# Patient Record
Sex: Male | Born: 1935 | Race: White | Hispanic: No | Marital: Married | Smoking: Former smoker
Health system: Southern US, Community
[De-identification: ages and names within clinical notes are randomized; demographics above are authoritative.]

## PROBLEM LIST (undated history)

## (undated) DIAGNOSIS — J449 Chronic obstructive pulmonary disease, unspecified: Secondary | ICD-10-CM

## (undated) DIAGNOSIS — I2510 Atherosclerotic heart disease of native coronary artery without angina pectoris: Secondary | ICD-10-CM

## (undated) DIAGNOSIS — T8859XA Other complications of anesthesia, initial encounter: Secondary | ICD-10-CM

## (undated) DIAGNOSIS — E119 Type 2 diabetes mellitus without complications: Secondary | ICD-10-CM

## (undated) DIAGNOSIS — K219 Gastro-esophageal reflux disease without esophagitis: Secondary | ICD-10-CM

## (undated) DIAGNOSIS — F419 Anxiety disorder, unspecified: Secondary | ICD-10-CM

## (undated) DIAGNOSIS — T4145XA Adverse effect of unspecified anesthetic, initial encounter: Secondary | ICD-10-CM

## (undated) DIAGNOSIS — R58 Hemorrhage, not elsewhere classified: Secondary | ICD-10-CM

## (undated) DIAGNOSIS — Z8719 Personal history of other diseases of the digestive system: Secondary | ICD-10-CM

## (undated) DIAGNOSIS — I639 Cerebral infarction, unspecified: Secondary | ICD-10-CM

## (undated) DIAGNOSIS — R0602 Shortness of breath: Secondary | ICD-10-CM

## (undated) DIAGNOSIS — M1990 Unspecified osteoarthritis, unspecified site: Secondary | ICD-10-CM

## (undated) DIAGNOSIS — K429 Umbilical hernia without obstruction or gangrene: Secondary | ICD-10-CM

## (undated) DIAGNOSIS — S31109A Unspecified open wound of abdominal wall, unspecified quadrant without penetration into peritoneal cavity, initial encounter: Secondary | ICD-10-CM

## (undated) DIAGNOSIS — I10 Essential (primary) hypertension: Secondary | ICD-10-CM

## (undated) DIAGNOSIS — I509 Heart failure, unspecified: Secondary | ICD-10-CM

## (undated) DIAGNOSIS — R51 Headache: Secondary | ICD-10-CM

## (undated) DIAGNOSIS — J45909 Unspecified asthma, uncomplicated: Secondary | ICD-10-CM

## (undated) DIAGNOSIS — I219 Acute myocardial infarction, unspecified: Secondary | ICD-10-CM

## (undated) DIAGNOSIS — L089 Local infection of the skin and subcutaneous tissue, unspecified: Secondary | ICD-10-CM

## (undated) DIAGNOSIS — J189 Pneumonia, unspecified organism: Secondary | ICD-10-CM

## (undated) DIAGNOSIS — D649 Anemia, unspecified: Secondary | ICD-10-CM

## (undated) HISTORY — PX: APPENDECTOMY: SHX54

## (undated) HISTORY — PX: SPLENECTOMY, TOTAL: SHX788

## (undated) HISTORY — PX: CHOLECYSTECTOMY: SHX55

## (undated) HISTORY — PX: TONSILLECTOMY: SUR1361

## (undated) HISTORY — PX: COLON SURGERY: SHX602

## (undated) HISTORY — PX: HERNIA REPAIR: SHX51

## (undated) HISTORY — PX: CARDIAC CATHETERIZATION: SHX172

---

## 1988-02-22 DIAGNOSIS — I219 Acute myocardial infarction, unspecified: Secondary | ICD-10-CM

## 1988-02-22 HISTORY — DX: Acute myocardial infarction, unspecified: I21.9

## 2005-06-30 ENCOUNTER — Ambulatory Visit: Payer: Self-pay | Admitting: Cardiovascular Disease

## 2005-06-30 ENCOUNTER — Inpatient Hospital Stay (HOSPITAL_COMMUNITY): Admission: EM | Admit: 2005-06-30 | Discharge: 2005-07-05 | Payer: Self-pay | Admitting: Emergency Medicine

## 2005-06-30 ENCOUNTER — Ambulatory Visit: Payer: Self-pay | Admitting: Hospitalist

## 2005-07-06 ENCOUNTER — Ambulatory Visit: Payer: Self-pay | Admitting: Internal Medicine

## 2005-07-07 ENCOUNTER — Encounter (HOSPITAL_BASED_OUTPATIENT_CLINIC_OR_DEPARTMENT_OTHER): Admission: RE | Admit: 2005-07-07 | Discharge: 2005-08-04 | Payer: Self-pay | Admitting: Surgery

## 2007-11-10 IMAGING — CR DG ABDOMEN ACUTE W/ 1V CHEST
4 series · 4 of 4 positions shown · non-contrast
Comparison: none

CLINICAL DATA: Abdominal pain, nausea, vomiting, drainage from abdominal hernia, shortness of breath, hypertension, diabetes, smoker, COPD.
ACUTE ABDOMINAL SERIES WITH CHEST ? 4 VIEWS ? 06/30/05:

[view not recorded (1 of 4)]
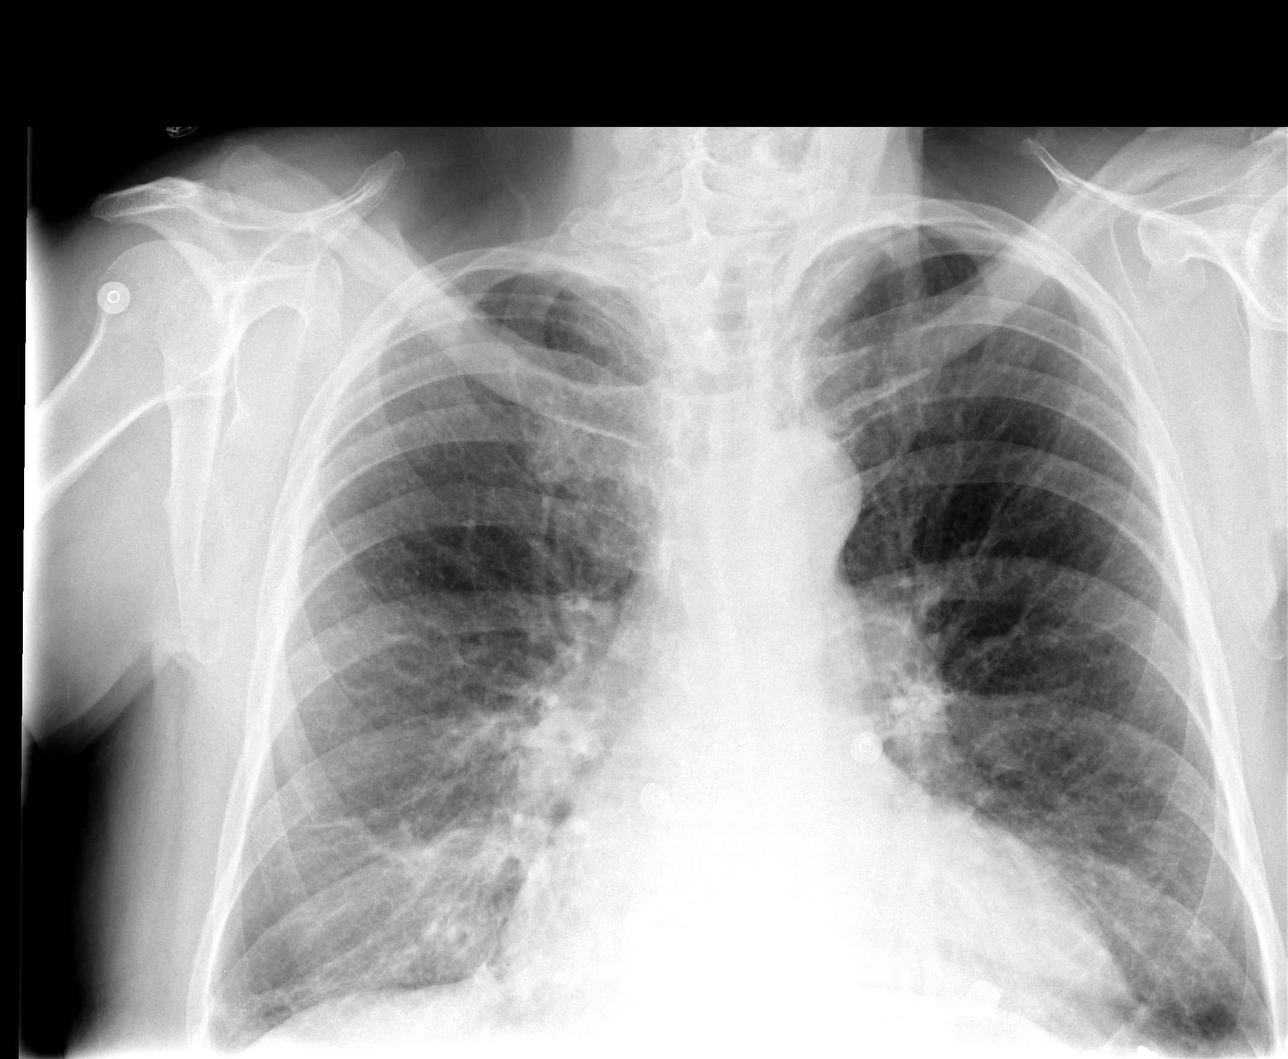

[view not recorded (2 of 4)]
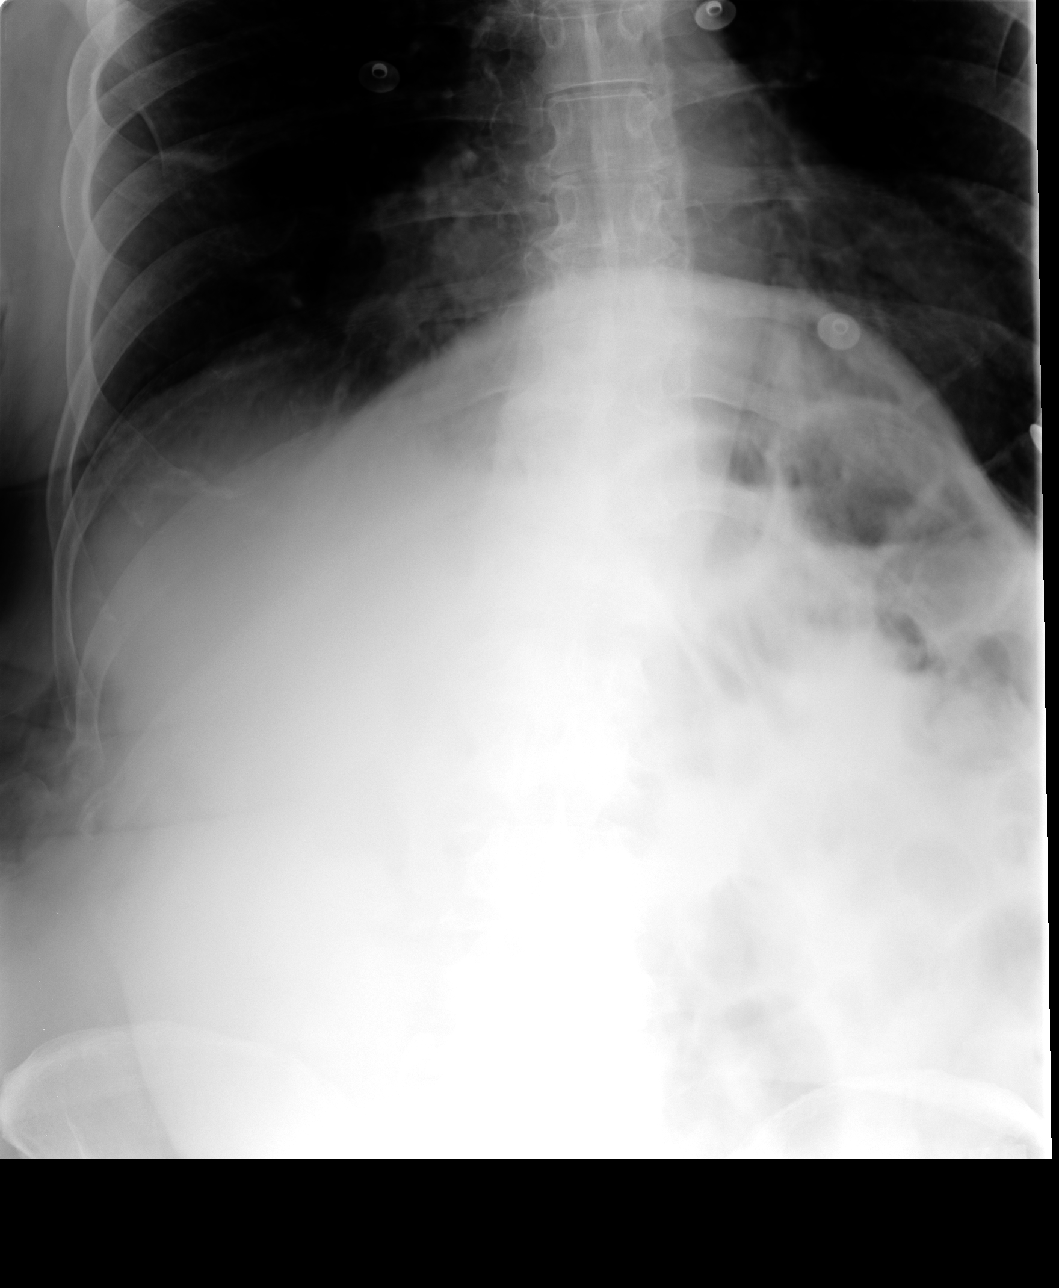

[view not recorded (3 of 4)]
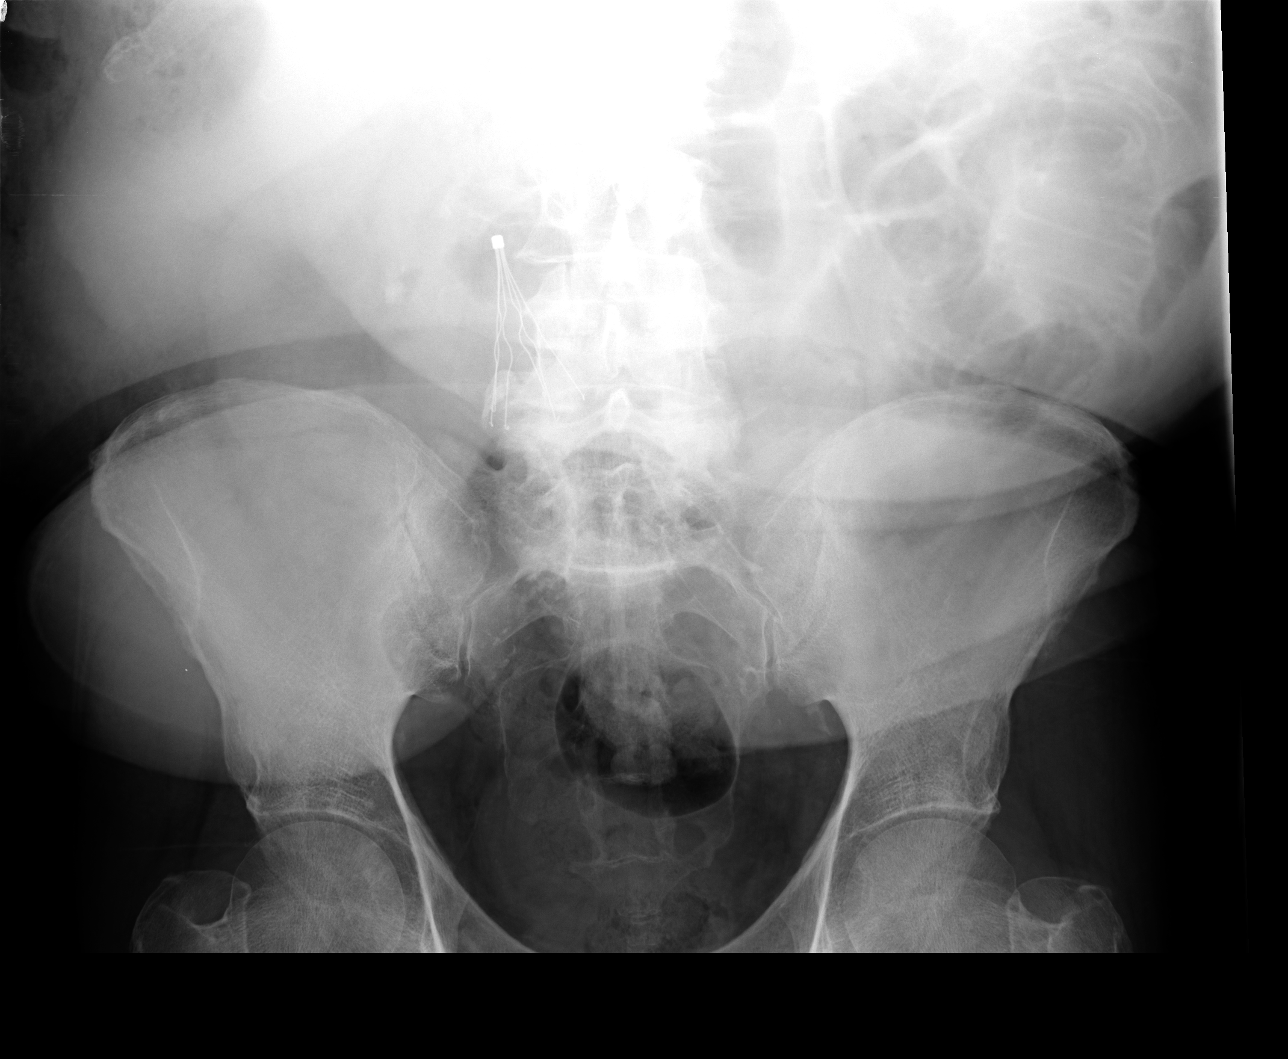

[view not recorded (4 of 4)]
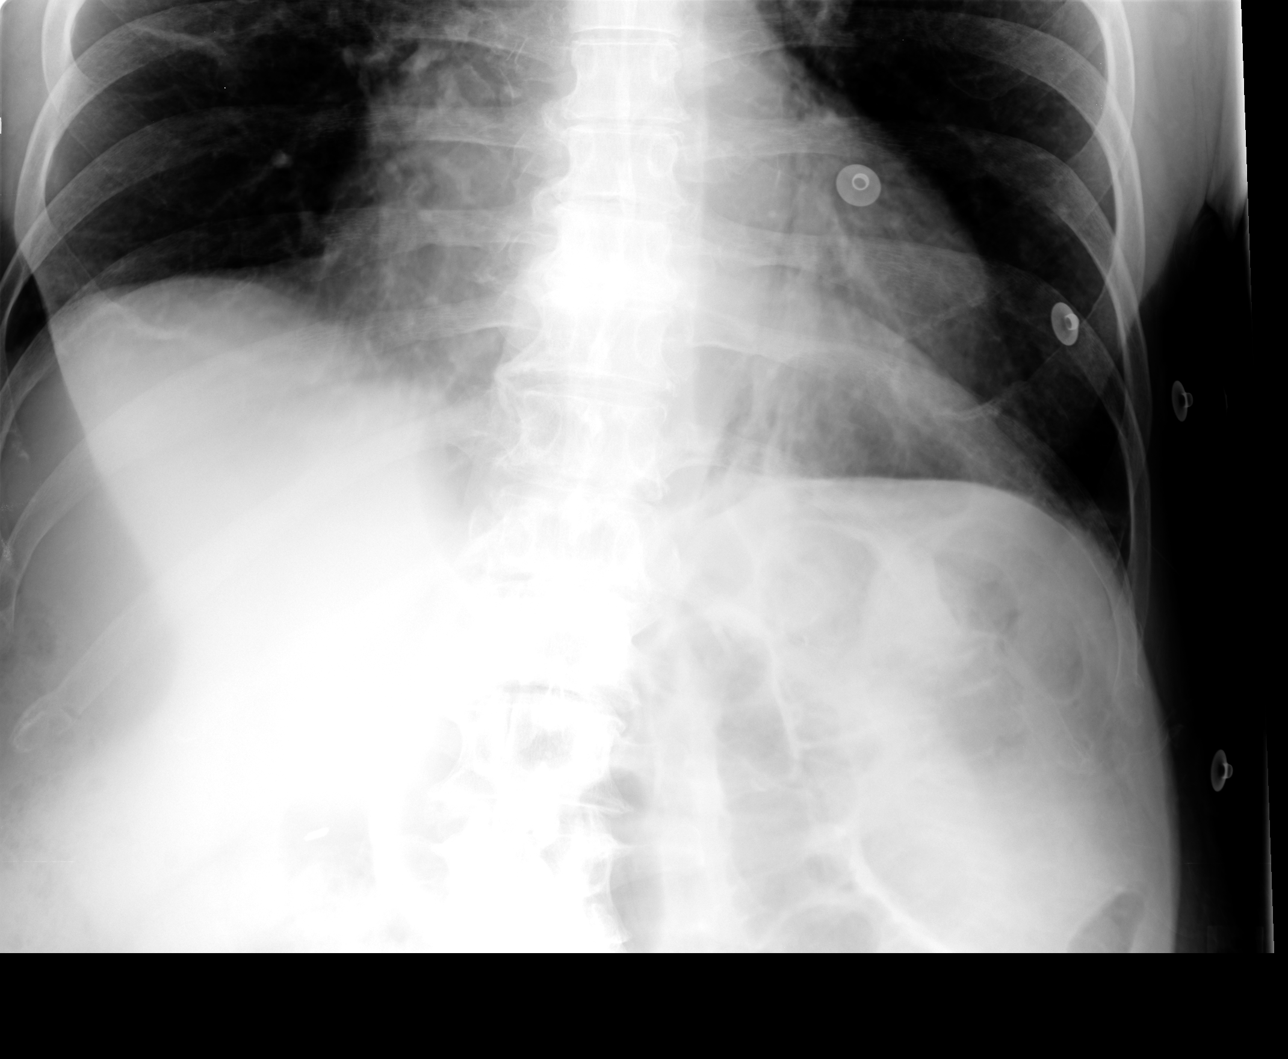

[4 of 4 positions shown; findings below may reference images not displayed]

FINDINGS: There are findings compatible with COPD.  No prior chest x-ray is available for correlation however, today?s examination reveals peribronchial thickening likely representing chronic bronchitic changes, particularly in the perihilar and lower lung zone regions.  I cannot exclude mild bronchiectasis in the lower lobes.  There is mild gaseous distention of a few small bowel loops in the left abdomen compatible with a nonspecific ileus.  An IVC filter is centered at the L4 level.  There is an approximately 8 x 9 mm calcification on the right at the L3-4 level likely lying in the right urinary tract, possibly the right renal collecting system, or the ureteropelvic junction region.  Metallic clips in the right upper quadrant.
IMPRESSION: Cardiomegaly and mild pulmonary vascular congestion.
COPD.  Chronic appearing bronchitic changes and potentially bronchiectasis in the lower lobes.
Mild nonspecific ileus.  Suspicion for right proximal urinary tract stone.
IVC filter.

## 2013-03-24 DIAGNOSIS — R58 Hemorrhage, not elsewhere classified: Secondary | ICD-10-CM

## 2013-03-24 HISTORY — DX: Hemorrhage, not elsewhere classified: R58

## 2013-04-26 ENCOUNTER — Encounter (INDEPENDENT_AMBULATORY_CARE_PROVIDER_SITE_OTHER): Payer: Medicare Other | Admitting: Ophthalmology

## 2013-04-26 DIAGNOSIS — E1139 Type 2 diabetes mellitus with other diabetic ophthalmic complication: Secondary | ICD-10-CM

## 2013-04-26 DIAGNOSIS — H43819 Vitreous degeneration, unspecified eye: Secondary | ICD-10-CM

## 2013-04-26 DIAGNOSIS — E1165 Type 2 diabetes mellitus with hyperglycemia: Secondary | ICD-10-CM

## 2013-04-26 DIAGNOSIS — E11319 Type 2 diabetes mellitus with unspecified diabetic retinopathy without macular edema: Secondary | ICD-10-CM

## 2013-04-26 DIAGNOSIS — H353290 Exudative age-related macular degeneration, unspecified eye, stage unspecified: Secondary | ICD-10-CM

## 2013-04-26 DIAGNOSIS — H35039 Hypertensive retinopathy, unspecified eye: Secondary | ICD-10-CM

## 2013-04-26 DIAGNOSIS — H3530 Unspecified macular degeneration: Secondary | ICD-10-CM

## 2013-04-26 DIAGNOSIS — I10 Essential (primary) hypertension: Secondary | ICD-10-CM

## 2013-05-24 ENCOUNTER — Encounter (INDEPENDENT_AMBULATORY_CARE_PROVIDER_SITE_OTHER): Payer: Medicare Other | Admitting: Ophthalmology

## 2013-05-24 DIAGNOSIS — H43819 Vitreous degeneration, unspecified eye: Secondary | ICD-10-CM

## 2013-05-24 DIAGNOSIS — E1165 Type 2 diabetes mellitus with hyperglycemia: Secondary | ICD-10-CM

## 2013-05-24 DIAGNOSIS — H3530 Unspecified macular degeneration: Secondary | ICD-10-CM

## 2013-05-24 DIAGNOSIS — E11319 Type 2 diabetes mellitus with unspecified diabetic retinopathy without macular edema: Secondary | ICD-10-CM

## 2013-05-24 DIAGNOSIS — H35039 Hypertensive retinopathy, unspecified eye: Secondary | ICD-10-CM

## 2013-05-24 DIAGNOSIS — H353290 Exudative age-related macular degeneration, unspecified eye, stage unspecified: Secondary | ICD-10-CM

## 2013-05-24 DIAGNOSIS — E1139 Type 2 diabetes mellitus with other diabetic ophthalmic complication: Secondary | ICD-10-CM

## 2013-05-24 DIAGNOSIS — I10 Essential (primary) hypertension: Secondary | ICD-10-CM

## 2013-06-21 ENCOUNTER — Encounter (INDEPENDENT_AMBULATORY_CARE_PROVIDER_SITE_OTHER): Payer: Medicare Other | Admitting: Ophthalmology

## 2013-06-21 DIAGNOSIS — E1165 Type 2 diabetes mellitus with hyperglycemia: Secondary | ICD-10-CM

## 2013-06-21 DIAGNOSIS — H353290 Exudative age-related macular degeneration, unspecified eye, stage unspecified: Secondary | ICD-10-CM

## 2013-06-21 DIAGNOSIS — H3530 Unspecified macular degeneration: Secondary | ICD-10-CM

## 2013-06-21 DIAGNOSIS — H43819 Vitreous degeneration, unspecified eye: Secondary | ICD-10-CM

## 2013-06-21 DIAGNOSIS — E1139 Type 2 diabetes mellitus with other diabetic ophthalmic complication: Secondary | ICD-10-CM

## 2013-06-21 DIAGNOSIS — E11319 Type 2 diabetes mellitus with unspecified diabetic retinopathy without macular edema: Secondary | ICD-10-CM

## 2013-06-21 DIAGNOSIS — I10 Essential (primary) hypertension: Secondary | ICD-10-CM

## 2013-06-21 DIAGNOSIS — H35039 Hypertensive retinopathy, unspecified eye: Secondary | ICD-10-CM

## 2013-07-26 ENCOUNTER — Encounter (INDEPENDENT_AMBULATORY_CARE_PROVIDER_SITE_OTHER): Payer: Medicare Other | Admitting: Ophthalmology

## 2013-07-26 DIAGNOSIS — E1165 Type 2 diabetes mellitus with hyperglycemia: Secondary | ICD-10-CM

## 2013-07-26 DIAGNOSIS — H43819 Vitreous degeneration, unspecified eye: Secondary | ICD-10-CM

## 2013-07-26 DIAGNOSIS — I10 Essential (primary) hypertension: Secondary | ICD-10-CM

## 2013-07-26 DIAGNOSIS — H353290 Exudative age-related macular degeneration, unspecified eye, stage unspecified: Secondary | ICD-10-CM

## 2013-07-26 DIAGNOSIS — H35039 Hypertensive retinopathy, unspecified eye: Secondary | ICD-10-CM

## 2013-07-26 DIAGNOSIS — H3530 Unspecified macular degeneration: Secondary | ICD-10-CM

## 2013-07-26 DIAGNOSIS — E1139 Type 2 diabetes mellitus with other diabetic ophthalmic complication: Secondary | ICD-10-CM

## 2013-07-26 DIAGNOSIS — E11319 Type 2 diabetes mellitus with unspecified diabetic retinopathy without macular edema: Secondary | ICD-10-CM

## 2013-08-30 ENCOUNTER — Encounter (INDEPENDENT_AMBULATORY_CARE_PROVIDER_SITE_OTHER): Payer: Medicare Other | Admitting: Ophthalmology

## 2013-08-30 DIAGNOSIS — E11319 Type 2 diabetes mellitus with unspecified diabetic retinopathy without macular edema: Secondary | ICD-10-CM

## 2013-08-30 DIAGNOSIS — E1165 Type 2 diabetes mellitus with hyperglycemia: Secondary | ICD-10-CM

## 2013-08-30 DIAGNOSIS — E1139 Type 2 diabetes mellitus with other diabetic ophthalmic complication: Secondary | ICD-10-CM

## 2013-08-30 DIAGNOSIS — H35039 Hypertensive retinopathy, unspecified eye: Secondary | ICD-10-CM

## 2013-08-30 DIAGNOSIS — I10 Essential (primary) hypertension: Secondary | ICD-10-CM

## 2013-08-30 DIAGNOSIS — H353290 Exudative age-related macular degeneration, unspecified eye, stage unspecified: Secondary | ICD-10-CM

## 2013-08-30 DIAGNOSIS — H3530 Unspecified macular degeneration: Secondary | ICD-10-CM

## 2013-08-30 DIAGNOSIS — H43819 Vitreous degeneration, unspecified eye: Secondary | ICD-10-CM

## 2013-10-04 ENCOUNTER — Encounter (INDEPENDENT_AMBULATORY_CARE_PROVIDER_SITE_OTHER): Payer: Medicare Other | Admitting: Ophthalmology

## 2013-10-04 DIAGNOSIS — E11319 Type 2 diabetes mellitus with unspecified diabetic retinopathy without macular edema: Secondary | ICD-10-CM

## 2013-10-04 DIAGNOSIS — E1165 Type 2 diabetes mellitus with hyperglycemia: Secondary | ICD-10-CM

## 2013-10-04 DIAGNOSIS — I10 Essential (primary) hypertension: Secondary | ICD-10-CM

## 2013-10-04 DIAGNOSIS — H353290 Exudative age-related macular degeneration, unspecified eye, stage unspecified: Secondary | ICD-10-CM

## 2013-10-04 DIAGNOSIS — E1139 Type 2 diabetes mellitus with other diabetic ophthalmic complication: Secondary | ICD-10-CM

## 2013-10-04 DIAGNOSIS — H35039 Hypertensive retinopathy, unspecified eye: Secondary | ICD-10-CM

## 2013-10-04 DIAGNOSIS — H3530 Unspecified macular degeneration: Secondary | ICD-10-CM

## 2013-10-04 DIAGNOSIS — H43819 Vitreous degeneration, unspecified eye: Secondary | ICD-10-CM

## 2013-10-10 NOTE — Patient Instructions (Signed)
Your procedure is scheduled on: 10/15/2013  Report [facility] at  900 AM.  Call this number if you have problems the morning of surgery: [phone]   Do not eat food or drink liquids :After Midnight.      Take these medicines the morning of surgery with A SIP OF WATER: none   Do not wear jewelry, make-up or nail polish.  Do not wear lotions, powders, or perfumes.   Do not shave 48 hours prior to surgery.  Do not bring valuables to the hospital.  Contacts, dentures or bridgework may not be worn into surgery.  Leave suitcase in the car. After surgery it may be brought to your room.  For patients admitted to the hospital, checkout time is 11:00 AM the day of discharge.   Patients discharged the day of surgery will not be allowed to drive home.  :     Please read over the following fact sheets that you were given: Coughing and Deep Breathing, Surgical Site Infection Prevention, Anesthesia Post-op Instructions and Care and Recovery After Surgery    Cataract A cataract is a clouding of the lens of the eye. When a lens becomes cloudy, vision is reduced based on the degree and nature of the clouding. Many cataracts reduce vision to some degree. Some cataracts make people more near-sighted as they develop. Other cataracts increase glare. Cataracts that are ignored and become worse can sometimes look white. The white color can be seen through the pupil. CAUSES   Aging. However, cataracts may occur at any age, even in newborns.   Certain drugs.   Trauma to the eye.   Certain diseases such as diabetes.   Specific eye diseases such as chronic inflammation inside the eye or a sudden attack of a rare form of glaucoma.   Inherited or acquired medical problems.  SYMPTOMS   Gradual, progressive drop in vision in the affected eye.   Severe, rapid visual loss. This most often happens when trauma is the cause.  DIAGNOSIS  To detect a cataract, an eye doctor examines the lens. Cataracts are  best diagnosed with an exam of the eyes with the pupils enlarged (dilated) by drops.  TREATMENT  For an early cataract, vision may improve by using different eyeglasses or stronger lighting. If that does not help your vision, surgery is the only effective treatment. A cataract needs to be surgically removed when vision loss interferes with your everyday activities, such as driving, reading, or watching TV. A cataract may also have to be removed if it prevents examination or treatment of another eye problem. Surgery removes the cloudy lens and usually replaces it with a substitute lens (intraocular lens, IOL).  At a time when both you and your doctor agree, the cataract will be surgically removed. If you have cataracts in both eyes, only one is usually removed at a time. This allows the operated eye to heal and be out of danger from any possible problems after surgery (such as infection or poor wound healing). In rare cases, a cataract may be doing damage to your eye. In these cases, your caregiver may advise surgical removal right away. The vast majority of people who have cataract surgery have better vision afterward. HOME CARE INSTRUCTIONS  If you are not planning surgery, you may be asked to do the following:  Use different eyeglasses.   Use stronger or brighter lighting.   Ask your eye doctor about reducing your medicine dose or changing medicines if it  is thought that a medicine caused your cataract. Changing medicines does not make the cataract go away on its own.   Become familiar with your surroundings. Poor vision can lead to injury. Avoid bumping into things on the affected side. You are at a higher risk for tripping or falling.   Exercise extreme care when driving or operating machinery.   Wear sunglasses if you are sensitive to bright light or experiencing problems with glare.  SEEK IMMEDIATE MEDICAL CARE IF:   You have a worsening or sudden vision loss.   You notice redness,  swelling, or increasing pain in the eye.   You have a fever.  Document Released: 02/07/2005 Document Revised: 01/27/2011 Document Reviewed: 10/01/2010 [facility] Patient Information 2012 [place].PATIENT INSTRUCTIONS POST-ANESTHESIA  IMMEDIATELY FOLLOWING SURGERY:  Do not drive or operate machinery for the first twenty four hours after surgery.  Do not make any important decisions for twenty four hours after surgery or while taking narcotic pain medications or sedatives.  If you develop intractable nausea and vomiting or a severe headache please notify your doctor immediately.  FOLLOW-UP:  Please make an appointment with your surgeon as instructed. You do not need to follow up with anesthesia unless specifically instructed to do so.  WOUND CARE INSTRUCTIONS (if applicable):  Keep a dry clean dressing on the anesthesia/puncture wound site if there is drainage.  Once the wound has quit draining you may leave it open to air.  Generally you should leave the bandage intact for twenty four hours unless there is drainage.  If the epidural site drains for more than 36-48 hours please call the anesthesia department.  QUESTIONS?:  Please feel free to call your physician or the hospital operator if you have any questions, and they will be happy to assist you.

## 2013-10-11 ENCOUNTER — Encounter (HOSPITAL_COMMUNITY): Payer: Self-pay

## 2013-10-11 ENCOUNTER — Encounter (HOSPITAL_COMMUNITY)
Admission: RE | Admit: 2013-10-11 | Discharge: 2013-10-11 | Disposition: A | Discharge status: Home / Self Care | Payer: Medicare Other | Source: Ambulatory Visit | Attending: Ophthalmology | Admitting: Ophthalmology

## 2013-10-11 ENCOUNTER — Encounter (HOSPITAL_COMMUNITY): Payer: Self-pay | Admitting: Pharmacy Technician

## 2013-10-11 DIAGNOSIS — H2510 Age-related nuclear cataract, unspecified eye: Secondary | ICD-10-CM | POA: Diagnosis not present

## 2013-10-11 DIAGNOSIS — Z01812 Encounter for preprocedural laboratory examination: Secondary | ICD-10-CM | POA: Insufficient documentation

## 2013-10-11 DIAGNOSIS — Z01818 Encounter for other preprocedural examination: Secondary | ICD-10-CM | POA: Diagnosis present

## 2013-10-11 HISTORY — DX: Local infection of the skin and subcutaneous tissue, unspecified: L08.9

## 2013-10-11 HISTORY — DX: Unspecified asthma, uncomplicated: J45.909

## 2013-10-11 HISTORY — DX: Other complications of anesthesia, initial encounter: T88.59XA

## 2013-10-11 HISTORY — DX: Chronic obstructive pulmonary disease, unspecified: J44.9

## 2013-10-11 HISTORY — DX: Heart failure, unspecified: I50.9

## 2013-10-11 HISTORY — DX: Anxiety disorder, unspecified: F41.9

## 2013-10-11 HISTORY — DX: Type 2 diabetes mellitus without complications: E11.9

## 2013-10-11 HISTORY — DX: Acute myocardial infarction, unspecified: I21.9

## 2013-10-11 HISTORY — DX: Unspecified osteoarthritis, unspecified site: M19.90

## 2013-10-11 HISTORY — DX: Pneumonia, unspecified organism: J18.9

## 2013-10-11 HISTORY — DX: Shortness of breath: R06.02

## 2013-10-11 HISTORY — DX: Essential (primary) hypertension: I10

## 2013-10-11 HISTORY — DX: Atherosclerotic heart disease of native coronary artery without angina pectoris: I25.10

## 2013-10-11 HISTORY — DX: Personal history of other diseases of the digestive system: Z87.19

## 2013-10-11 HISTORY — DX: Unspecified open wound of abdominal wall, unspecified quadrant without penetration into peritoneal cavity, initial encounter: S31.109A

## 2013-10-11 HISTORY — DX: Headache: R51

## 2013-10-11 HISTORY — DX: Umbilical hernia without obstruction or gangrene: K42.9

## 2013-10-11 HISTORY — DX: Hemorrhage, not elsewhere classified: R58

## 2013-10-11 HISTORY — DX: Adverse effect of unspecified anesthetic, initial encounter: T41.45XA

## 2013-10-11 HISTORY — DX: Cerebral infarction, unspecified: I63.9

## 2013-10-11 HISTORY — DX: Gastro-esophageal reflux disease without esophagitis: K21.9

## 2013-10-11 HISTORY — DX: Anemia, unspecified: D64.9

## 2013-10-11 LAB — BASIC METABOLIC PANEL
Anion gap: 12 (ref 5–15)
BUN: 19 mg/dL (ref 6–23)
CALCIUM: 9.9 mg/dL (ref 8.4–10.5)
CHLORIDE: 101 meq/L (ref 96–112)
CO2: 27 meq/L (ref 19–32)
CREATININE: 1.5 mg/dL — AB (ref 0.50–1.35)
GFR calc Af Amer: 50 mL/min — ABNORMAL LOW (ref >90)
GFR calc non Af Amer: 43 mL/min — ABNORMAL LOW (ref >90)
GLUCOSE: 95 mg/dL (ref 70–99)
POTASSIUM: 4.2 meq/L (ref 3.7–5.3)
Sodium: 140 mEq/L (ref 137–147)

## 2013-10-11 LAB — HEMOGLOBIN AND HEMATOCRIT, BLOOD
HEMATOCRIT: 32.1 % — AB (ref 39.0–52.0)
HEMOGLOBIN: 9.9 g/dL — AB (ref 13.0–17.0)

## 2013-10-11 NOTE — Pre-Procedure Instructions (Signed)
Patient presented for PAT with profound cough, SOB and O2 saturation of 87% on room air.  No documented CXR since 2007.  States that he only uses oxygen when needed.  EKG noted to be abnormal with no previous tracing to compare to.  Notified Dr Jayme CloudGonzalez of findings. Was advised to contact Dr Nile RiggsShapiro with this information, as patient would need to be seen by Dr Olena LeatherwoodHasanaj for clearance prior to proceeding with surgery.  Dr Nile RiggsShapiro in agree ance and appointment made for today at 2:00 for pre op clearance.  Will follow up this afternoon.  Family and patient aware and verbalize understanding.

## 2013-10-14 MED ORDER — PHENYLEPHRINE HCL 2.5 % OP SOLN
OPHTHALMIC | Status: AC
  Filled 2013-10-14: qty 15

## 2013-10-14 MED ORDER — KETOROLAC TROMETHAMINE 0.5 % OP SOLN
OPHTHALMIC | Status: AC
  Filled 2013-10-14: qty 5

## 2013-10-14 MED ORDER — TETRACAINE HCL 0.5 % OP SOLN
OPHTHALMIC | Status: AC
  Filled 2013-10-14: qty 2

## 2013-10-14 MED ORDER — CYCLOPENTOLATE-PHENYLEPHRINE OP SOLN OPTIME - NO CHARGE
OPHTHALMIC | Status: AC
  Filled 2013-10-14: qty 2

## 2013-10-15 ENCOUNTER — Encounter (HOSPITAL_COMMUNITY)
Admission: RE | Disposition: A | Discharge status: Home / Self Care | Payer: Self-pay | Source: Ambulatory Visit | Attending: Ophthalmology

## 2013-10-15 ENCOUNTER — Ambulatory Visit (HOSPITAL_COMMUNITY): Payer: Medicare Other | Admitting: Anesthesiology

## 2013-10-15 ENCOUNTER — Ambulatory Visit (HOSPITAL_COMMUNITY)
Admission: RE | Admit: 2013-10-15 | Discharge: 2013-10-15 | Disposition: A | Discharge status: Home / Self Care | Payer: Medicare Other | Source: Ambulatory Visit | Attending: Ophthalmology | Admitting: Ophthalmology

## 2013-10-15 ENCOUNTER — Encounter (HOSPITAL_COMMUNITY): Payer: Medicare Other | Admitting: Anesthesiology

## 2013-10-15 DIAGNOSIS — J449 Chronic obstructive pulmonary disease, unspecified: Secondary | ICD-10-CM | POA: Diagnosis not present

## 2013-10-15 DIAGNOSIS — E119 Type 2 diabetes mellitus without complications: Secondary | ICD-10-CM | POA: Diagnosis not present

## 2013-10-15 DIAGNOSIS — H2510 Age-related nuclear cataract, unspecified eye: Secondary | ICD-10-CM | POA: Diagnosis not present

## 2013-10-15 DIAGNOSIS — F411 Generalized anxiety disorder: Secondary | ICD-10-CM | POA: Diagnosis not present

## 2013-10-15 DIAGNOSIS — I10 Essential (primary) hypertension: Secondary | ICD-10-CM | POA: Insufficient documentation

## 2013-10-15 DIAGNOSIS — Z87891 Personal history of nicotine dependence: Secondary | ICD-10-CM | POA: Insufficient documentation

## 2013-10-15 DIAGNOSIS — K219 Gastro-esophageal reflux disease without esophagitis: Secondary | ICD-10-CM | POA: Insufficient documentation

## 2013-10-15 DIAGNOSIS — Z79899 Other long term (current) drug therapy: Secondary | ICD-10-CM | POA: Diagnosis not present

## 2013-10-15 DIAGNOSIS — J4489 Other specified chronic obstructive pulmonary disease: Secondary | ICD-10-CM | POA: Insufficient documentation

## 2013-10-15 HISTORY — PX: CATARACT EXTRACTION W/PHACO: SHX586

## 2013-10-15 LAB — GLUCOSE, CAPILLARY: GLUCOSE-CAPILLARY: 135 mg/dL — AB (ref 70–99)

## 2013-10-15 SURGERY — PHACOEMULSIFICATION, CATARACT, WITH IOL INSERTION
Anesthesia: Monitor Anesthesia Care | Site: Eye | Laterality: Right

## 2013-10-15 MED ORDER — PHENYLEPHRINE HCL 2.5 % OP SOLN
1.0000 [drp] | OPHTHALMIC | Status: AC
  Administered 2013-10-15 (×3): 1 [drp] via OPHTHALMIC

## 2013-10-15 MED ORDER — BSS IO SOLN
INTRAOCULAR | Status: DC | PRN
  Administered 2013-10-15: 15 mL via INTRAOCULAR

## 2013-10-15 MED ORDER — CYCLOPENTOLATE-PHENYLEPHRINE 0.2-1 % OP SOLN
1.0000 [drp] | OPHTHALMIC | Status: AC
  Administered 2013-10-15 (×3): 1 [drp] via OPHTHALMIC

## 2013-10-15 MED ORDER — EPINEPHRINE HCL 1 MG/ML IJ SOLN
INTRAMUSCULAR | Status: AC
  Filled 2013-10-15: qty 1

## 2013-10-15 MED ORDER — PROVISC 10 MG/ML IO SOLN
INTRAOCULAR | Status: DC | PRN
  Administered 2013-10-15: 0.85 mL via INTRAOCULAR

## 2013-10-15 MED ORDER — KETOROLAC TROMETHAMINE 0.5 % OP SOLN
1.0000 [drp] | OPHTHALMIC | Status: AC
  Administered 2013-10-15 (×3): 1 [drp] via OPHTHALMIC

## 2013-10-15 MED ORDER — EPINEPHRINE HCL 1 MG/ML IJ SOLN
INTRAOCULAR | Status: DC | PRN
  Administered 2013-10-15: 10:00:00

## 2013-10-15 MED ORDER — MIDAZOLAM HCL 2 MG/2ML IJ SOLN
INTRAMUSCULAR | Status: AC
  Filled 2013-10-15: qty 2

## 2013-10-15 MED ORDER — FENTANYL CITRATE 0.05 MG/ML IJ SOLN
25.0000 ug | INTRAMUSCULAR | Status: AC
  Administered 2013-10-15: 25 ug via INTRAVENOUS

## 2013-10-15 MED ORDER — LACTATED RINGERS IV SOLN
INTRAVENOUS | Status: DC
  Administered 2013-10-15: 1000 mL via INTRAVENOUS

## 2013-10-15 MED ORDER — MIDAZOLAM HCL 2 MG/2ML IJ SOLN
1.0000 mg | INTRAMUSCULAR | Status: DC | PRN
  Administered 2013-10-15: 2 mg via INTRAVENOUS

## 2013-10-15 MED ORDER — FENTANYL CITRATE 0.05 MG/ML IJ SOLN
INTRAMUSCULAR | Status: AC
  Filled 2013-10-15: qty 2

## 2013-10-15 MED ORDER — TETRACAINE 0.5 % OP SOLN OPTIME - NO CHARGE
OPHTHALMIC | Status: DC | PRN
  Administered 2013-10-15: 2 [drp] via OPHTHALMIC

## 2013-10-15 MED ORDER — TETRACAINE HCL 0.5 % OP SOLN
1.0000 [drp] | OPHTHALMIC | Status: AC
  Administered 2013-10-15 (×3): 1 [drp] via OPHTHALMIC

## 2013-10-15 SURGICAL SUPPLY — 25 items
CAPSULAR TENSION RING-AMO (OPHTHALMIC RELATED) IMPLANT
CLOTH BEACON ORANGE TIMEOUT ST (SAFETY) ×3 IMPLANT
EYE SHIELD UNIVERSAL CLEAR (GAUZE/BANDAGES/DRESSINGS) ×3 IMPLANT
GLOVE BIO SURGEON STRL SZ 6.5 (GLOVE) ×2 IMPLANT
GLOVE BIO SURGEONS STRL SZ 6.5 (GLOVE) ×1
GLOVE ECLIPSE 6.5 STRL STRAW (GLOVE) IMPLANT
GLOVE ECLIPSE 7.0 STRL STRAW (GLOVE) IMPLANT
GLOVE EXAM NITRILE LRG STRL (GLOVE) IMPLANT
GLOVE EXAM NITRILE MD LF STRL (GLOVE) ×3 IMPLANT
GLOVE SKINSENSE NS SZ6.5 (GLOVE)
GLOVE SKINSENSE STRL SZ6.5 (GLOVE) IMPLANT
HEALON 5 0.6 ML (INTRAOCULAR LENS) IMPLANT
KIT VITRECTOMY (OPHTHALMIC RELATED) IMPLANT
PAD ARMBOARD 7.5X6 YLW CONV (MISCELLANEOUS) ×3 IMPLANT
PROC W NO LENS (INTRAOCULAR LENS)
PROC W SPEC LENS (INTRAOCULAR LENS)
PROCESS W NO LENS (INTRAOCULAR LENS) IMPLANT
PROCESS W SPEC LENS (INTRAOCULAR LENS) IMPLANT
RETRACTOR IRIS SIGHTPATH (OPHTHALMIC RELATED) IMPLANT
RING MALYGIN (MISCELLANEOUS) IMPLANT
SIGHTPATH CAT PROC W REG LENS (Ophthalmic Related) ×3 IMPLANT
TAPE SURG TRANSPORE 1 IN (GAUZE/BANDAGES/DRESSINGS) ×1 IMPLANT
TAPE SURGICAL TRANSPORE 1 IN (GAUZE/BANDAGES/DRESSINGS) ×2
VISCOELASTIC ADDITIONAL (OPHTHALMIC RELATED) IMPLANT
WATER STERILE IRR 250ML POUR (IV SOLUTION) ×3 IMPLANT

## 2013-10-15 NOTE — Anesthesia Postprocedure Evaluation (Signed)
  Anesthesia Post-op Note  Patient: Gerald Robbins  Procedure(s) Performed: Procedure(s) with comments: CATARACT EXTRACTION PHACO AND INTRAOCULAR LENS PLACEMENT (IOC) (Right) - CDE:10.17  Patient Location: Short Stay  Anesthesia Type:MAC  Level of Consciousness: awake, alert , oriented and patient cooperative  Airway and Oxygen Therapy: Patient Spontanous Breathing and Patient connected to nasal cannula oxygen  Post-op Pain: none  Post-op Assessment: Post-op Vital signs reviewed, Patient's Cardiovascular Status Stable, Respiratory Function Stable, Patent Airway, No signs of Nausea or vomiting and Pain level controlled  Post-op Vital Signs: Reviewed and stable  Last Vitals:  Filed Vitals:   10/15/13 0950  BP: 155/96  Temp:   Resp: 22    Complications: No apparent anesthesia complications

## 2013-10-15 NOTE — Anesthesia Procedure Notes (Signed)
Procedure Name: MAC Date/Time: 10/15/2013 9:51 AM Performed by: Pernell Dupre, AMY A Pre-anesthesia Checklist: Patient identified, Timeout performed, Emergency Drugs available, Suction available and Patient being monitored Oxygen Delivery Method: Nasal cannula

## 2013-10-15 NOTE — Anesthesia Preprocedure Evaluation (Addendum)
Anesthesia Evaluation  Patient identified by MRN, date of birth, ID band Patient awake    Reviewed: Allergy & Precautions, H&P , NPO status , Patient's Chart, lab work & pertinent test results, reviewed documented beta blocker date and time   History of Anesthesia Complications (+) history of anesthetic complications (required vent support post op)  Airway Mallampati: I TM Distance: >3 FB     Dental  (+) Edentulous Upper, Edentulous Lower   Pulmonary shortness of breath and with exertion, asthma , COPDformer smoker,  breath sounds clear to auscultation        Cardiovascular hypertension, Pt. on medications and Pt. on home beta blockers + CAD, + Past MI and +CHF Rhythm:Regular Rate:Normal     Neuro/Psych  Headaches, PSYCHIATRIC DISORDERS Anxiety CVA    GI/Hepatic hiatal hernia, GERD-  ,  Endo/Other  diabetes, Type 2, Oral Hypoglycemic Agents  Renal/GU      Musculoskeletal   Abdominal   Peds  Hematology   Anesthesia Other Findings   Reproductive/Obstetrics                          Anesthesia Physical Anesthesia Plan  ASA: IV  Anesthesia Plan: MAC   Post-op Pain Management:    Induction: Intravenous  Airway Management Planned: Nasal Cannula  Additional Equipment:   Intra-op Plan:   Post-operative Plan:   Informed Consent: I have reviewed the patients History and Physical, chart, labs and discussed the procedure including the risks, benefits and alternatives for the proposed anesthesia with the patient or authorized representative who has indicated his/her understanding and acceptance.     Plan Discussed with:   Anesthesia Plan Comments:         Anesthesia Quick Evaluation

## 2013-10-15 NOTE — Discharge Instructions (Signed)
Trevaun Antonacci  10/15/2013           [facility] Instructions [address]- [place] [address]      1. Avoid closing eyes tightly. One often closes the eye tightly when laughing, talking, sneezing, coughing or if they feel irritated. At these times, you should be careful not to close your eyes tightly.  2. Instill eye drops as instructed. To instill drops in your eye, open it, look up and have someone gently pull the lower lid down and instill a couple of drops inside the lower lid.  3. Do not touch upper lid.  4. Take Advil or Tylenol for pain.  5. You may use either eye for near work, such as reading or sewing and you may watch television.  6. You may have your hair done at the beauty parlor at any time.  7. Wear dark glasses with or without your own glasses if you are in bright light.  8. Call our office at [phone] or [phone] if you have sharp pain in your eye or unusual symptoms.  9. Do not be concerned because vision in the operative eye is not good. It will not be good, no matter how successful the operation, until you get a special lens for it. Your old glasses will not be suited to the new eye that was operated on and you will not be ready for a new lens for about a month.  10. Follow up at the [facility] office.    I have received a copy of the above instructions and will follow them.    PATIENT INSTRUCTIONS POST-ANESTHESIA  IMMEDIATELY FOLLOWING SURGERY:  Do not drive or operate machinery for the first twenty four hours after surgery.  Do not make any important decisions for twenty four hours after surgery or while taking narcotic pain medications or sedatives.  If you develop intractable nausea and vomiting or a severe headache please notify your doctor immediately.  FOLLOW-UP:  Please make an appointment with your surgeon as instructed. You do not need to follow up with anesthesia unless specifically instructed to do  so.  WOUND CARE INSTRUCTIONS (if applicable):  Keep a dry clean dressing on the anesthesia/puncture wound site if there is drainage.  Once the wound has quit draining you may leave it open to air.  Generally you should leave the bandage intact for twenty four hours unless there is drainage.  If the epidural site drains for more than 36-48 hours please call the anesthesia department.  QUESTIONS?:  Please feel free to call your physician or the hospital operator if you have any questions, and they will be happy to assist you.

## 2013-10-15 NOTE — Transfer of Care (Signed)
Immediate Anesthesia Transfer of Care Note  Patient: Gerald Robbins  Procedure(s) Performed: Procedure(s) with comments: CATARACT EXTRACTION PHACO AND INTRAOCULAR LENS PLACEMENT (IOC) (Right) - CDE:10.17  Patient Location: Short Stay  Anesthesia Type:MAC  Level of Consciousness: awake, alert , oriented and patient cooperative  Airway & Oxygen Therapy: Patient Spontanous Breathing and Patient connected to nasal cannula oxygen  Post-op Assessment: Report given to PACU RN and Post -op Vital signs reviewed and stable  Post vital signs: Reviewed and stable  Complications: No apparent anesthesia complications

## 2013-10-15 NOTE — H&P (Signed)
The patient was re examined and there is no change in the patients condition since the original H and P. 

## 2013-10-15 NOTE — Op Note (Signed)
Patient brought to the operating room and prepped and draped in the usual manner.  Lid speculum inserted in right eye.  Stab incision made at the twelve o'clock position.  Provisc instilled in the anterior chamber.   A 2.4 mm. Stab incision was made temporally.  An anterior capsulotomy was done with a bent 25 gauge needle.  The nucleus was hydrodissected.  The Phaco tip was inserted in the anterior chamber and the nucleus was emulsified.  CDE was 10.17.  The cortical material was then removed with the I and A tip.  Posterior capsule was the polished.  The anterior chamber was deepened with Provisc.  A 22.0 Diopter Rayner 570C IOL was then inserted in the capsular bag.  Provisc was then removed with the I and A tip.  The wound was then hydrated.  Patient sent to the Recovery Room in good condition with follow up in my office.  Preoperative Diagnosis:  Nuclear Cataract OD Postoperative Diagnosis:  Same Procedure name: Kelman Phacoemulsification OD with IOL

## 2013-10-16 ENCOUNTER — Encounter (HOSPITAL_COMMUNITY): Payer: Self-pay | Admitting: Ophthalmology

## 2013-10-23 MED ORDER — ONDANSETRON HCL 4 MG/2ML IJ SOLN: 4.0000 mg | Freq: Once | INTRAMUSCULAR | Status: AC | PRN

## 2013-10-23 MED ORDER — FENTANYL CITRATE 0.05 MG/ML IJ SOLN: 25.0000 ug | INTRAMUSCULAR | Status: DC | PRN

## 2013-10-24 ENCOUNTER — Encounter (HOSPITAL_COMMUNITY)
Admission: RE | Admit: 2013-10-24 | Discharge: 2013-10-24 | Disposition: A | Discharge status: Home / Self Care | Payer: Medicare Other | Source: Ambulatory Visit | Attending: Ophthalmology | Admitting: Ophthalmology

## 2013-10-29 ENCOUNTER — Ambulatory Visit (HOSPITAL_COMMUNITY): Payer: Medicare Other | Admitting: Anesthesiology

## 2013-10-29 ENCOUNTER — Encounter (HOSPITAL_COMMUNITY): Payer: Self-pay | Admitting: *Deleted

## 2013-10-29 ENCOUNTER — Encounter (HOSPITAL_COMMUNITY): Payer: Medicare Other | Admitting: Anesthesiology

## 2013-10-29 ENCOUNTER — Ambulatory Visit (HOSPITAL_COMMUNITY)
Admission: RE | Admit: 2013-10-29 | Discharge: 2013-10-29 | Disposition: A | Discharge status: Home / Self Care | Payer: Medicare Other | Source: Ambulatory Visit | Attending: Ophthalmology | Admitting: Ophthalmology

## 2013-10-29 ENCOUNTER — Encounter (HOSPITAL_COMMUNITY)
Admission: RE | Disposition: A | Discharge status: Home / Self Care | Payer: Self-pay | Source: Ambulatory Visit | Attending: Ophthalmology

## 2013-10-29 DIAGNOSIS — Z79899 Other long term (current) drug therapy: Secondary | ICD-10-CM | POA: Diagnosis not present

## 2013-10-29 DIAGNOSIS — J449 Chronic obstructive pulmonary disease, unspecified: Secondary | ICD-10-CM | POA: Diagnosis not present

## 2013-10-29 DIAGNOSIS — E7800 Pure hypercholesterolemia, unspecified: Secondary | ICD-10-CM | POA: Diagnosis not present

## 2013-10-29 DIAGNOSIS — K219 Gastro-esophageal reflux disease without esophagitis: Secondary | ICD-10-CM | POA: Diagnosis not present

## 2013-10-29 DIAGNOSIS — J4489 Other specified chronic obstructive pulmonary disease: Secondary | ICD-10-CM | POA: Insufficient documentation

## 2013-10-29 DIAGNOSIS — H2510 Age-related nuclear cataract, unspecified eye: Secondary | ICD-10-CM | POA: Insufficient documentation

## 2013-10-29 DIAGNOSIS — Z862 Personal history of diseases of the blood and blood-forming organs and certain disorders involving the immune mechanism: Secondary | ICD-10-CM | POA: Diagnosis not present

## 2013-10-29 DIAGNOSIS — F411 Generalized anxiety disorder: Secondary | ICD-10-CM | POA: Insufficient documentation

## 2013-10-29 DIAGNOSIS — I10 Essential (primary) hypertension: Secondary | ICD-10-CM | POA: Insufficient documentation

## 2013-10-29 DIAGNOSIS — Z87891 Personal history of nicotine dependence: Secondary | ICD-10-CM | POA: Diagnosis not present

## 2013-10-29 DIAGNOSIS — E119 Type 2 diabetes mellitus without complications: Secondary | ICD-10-CM | POA: Insufficient documentation

## 2013-10-29 HISTORY — PX: CATARACT EXTRACTION W/PHACO: SHX586

## 2013-10-29 LAB — GLUCOSE, CAPILLARY: Glucose-Capillary: 179 mg/dL — ABNORMAL HIGH (ref 70–99)

## 2013-10-29 SURGERY — PHACOEMULSIFICATION, CATARACT, WITH IOL INSERTION
Anesthesia: Monitor Anesthesia Care | Laterality: Left

## 2013-10-29 MED ORDER — MIDAZOLAM HCL 2 MG/2ML IJ SOLN
INTRAMUSCULAR | Status: AC
  Filled 2013-10-29: qty 2

## 2013-10-29 MED ORDER — CYCLOPENTOLATE-PHENYLEPHRINE 0.2-1 % OP SOLN
1.0000 [drp] | OPHTHALMIC | Status: AC
  Administered 2013-10-29 (×3): 1 [drp] via OPHTHALMIC

## 2013-10-29 MED ORDER — TETRACAINE HCL 0.5 % OP SOLN
1.0000 [drp] | OPHTHALMIC | Status: AC | PRN
  Administered 2013-10-29 (×3): 1 [drp] via OPHTHALMIC

## 2013-10-29 MED ORDER — PROVISC 10 MG/ML IO SOLN
INTRAOCULAR | Status: DC | PRN
  Administered 2013-10-29: 0.85 mL via INTRAOCULAR

## 2013-10-29 MED ORDER — PHENYLEPHRINE HCL 2.5 % OP SOLN
OPHTHALMIC | Status: AC
  Filled 2013-10-29: qty 15

## 2013-10-29 MED ORDER — FENTANYL CITRATE 0.05 MG/ML IJ SOLN
INTRAMUSCULAR | Status: AC
  Filled 2013-10-29: qty 2

## 2013-10-29 MED ORDER — KETOROLAC TROMETHAMINE 0.5 % OP SOLN
OPHTHALMIC | Status: AC
  Filled 2013-10-29: qty 5

## 2013-10-29 MED ORDER — KETOROLAC TROMETHAMINE 0.5 % OP SOLN
1.0000 [drp] | OPHTHALMIC | Status: AC
  Administered 2013-10-29 (×3): 1 [drp] via OPHTHALMIC

## 2013-10-29 MED ORDER — EPINEPHRINE HCL 1 MG/ML IJ SOLN
INTRAMUSCULAR | Status: AC
  Filled 2013-10-29: qty 1

## 2013-10-29 MED ORDER — CYCLOPENTOLATE-PHENYLEPHRINE OP SOLN OPTIME - NO CHARGE
OPHTHALMIC | Status: AC
  Filled 2013-10-29: qty 2

## 2013-10-29 MED ORDER — LACTATED RINGERS IV SOLN
INTRAVENOUS | Status: DC
Start: 2013-10-29 — End: 2013-10-29
  Administered 2013-10-29: 1000 mL via INTRAVENOUS

## 2013-10-29 MED ORDER — TETRACAINE HCL 0.5 % OP SOLN
OPHTHALMIC | Status: AC
  Filled 2013-10-29: qty 2

## 2013-10-29 MED ORDER — TETRACAINE 0.5 % OP SOLN OPTIME - NO CHARGE
OPHTHALMIC | Status: DC | PRN
  Administered 2013-10-29: 2 [drp] via OPHTHALMIC

## 2013-10-29 MED ORDER — EPINEPHRINE HCL 1 MG/ML IJ SOLN
INTRAMUSCULAR | Status: DC | PRN
  Administered 2013-10-29: 10:00:00

## 2013-10-29 MED ORDER — BSS IO SOLN
INTRAOCULAR | Status: DC | PRN
  Administered 2013-10-29: 15 mL

## 2013-10-29 MED ORDER — MIDAZOLAM HCL 2 MG/2ML IJ SOLN
1.0000 mg | INTRAMUSCULAR | Status: DC | PRN
  Administered 2013-10-29: 2 mg via INTRAVENOUS

## 2013-10-29 MED ORDER — PHENYLEPHRINE HCL 2.5 % OP SOLN
1.0000 [drp] | OPHTHALMIC | Status: AC
  Administered 2013-10-29 (×3): 1 [drp] via OPHTHALMIC

## 2013-10-29 MED ORDER — FENTANYL CITRATE 0.05 MG/ML IJ SOLN
25.0000 ug | INTRAMUSCULAR | Status: AC
Start: 2013-10-29 — End: 2013-10-29
  Administered 2013-10-29 (×2): 25 ug via INTRAVENOUS

## 2013-10-29 SURGICAL SUPPLY — 10 items
CLOTH BEACON ORANGE TIMEOUT ST (SAFETY) ×3 IMPLANT
EYE SHIELD UNIVERSAL CLEAR (GAUZE/BANDAGES/DRESSINGS) ×3 IMPLANT
GLOVE BIOGEL PI IND STRL 7.0 (GLOVE) ×1 IMPLANT
GLOVE BIOGEL PI INDICATOR 7.0 (GLOVE) ×2
GLOVE EXAM NITRILE MD LF STRL (GLOVE) ×3 IMPLANT
PAD ARMBOARD 7.5X6 YLW CONV (MISCELLANEOUS) ×3 IMPLANT
SIGHTPATH CAT PROC W REG LENS (Ophthalmic Related) ×3 IMPLANT
TAPE SURG TRANSPORE 1 IN (GAUZE/BANDAGES/DRESSINGS) ×1 IMPLANT
TAPE SURGICAL TRANSPORE 1 IN (GAUZE/BANDAGES/DRESSINGS) ×2
WATER STERILE IRR 250ML POUR (IV SOLUTION) ×3 IMPLANT

## 2013-10-29 NOTE — Anesthesia Preprocedure Evaluation (Signed)
Anesthesia Evaluation  Patient identified by MRN, date of birth, ID band Patient awake    Reviewed: Allergy & Precautions, H&P , NPO status , Patient's Chart, lab work & pertinent test results, reviewed documented beta blocker date and time   History of Anesthesia Complications (+) history of anesthetic complications (required vent support post op)  Airway Mallampati: I TM Distance: >3 FB     Dental  (+) Edentulous Upper, Edentulous Lower   Pulmonary shortness of breath and with exertion, asthma , COPDformer smoker,  breath sounds clear to auscultation        Cardiovascular hypertension, Pt. on medications and Pt. on home beta blockers + CAD, + Past MI and +CHF Rhythm:Regular Rate:Normal     Neuro/Psych  Headaches, PSYCHIATRIC DISORDERS Anxiety CVA    GI/Hepatic hiatal hernia, GERD-  ,  Endo/Other  diabetes, Type 2, Oral Hypoglycemic Agents  Renal/GU      Musculoskeletal  (+) Arthritis -,   Abdominal   Peds  Hematology  (+) anemia ,   Anesthesia Other Findings   Reproductive/Obstetrics                           Anesthesia Physical Anesthesia Plan  ASA: IV  Anesthesia Plan: MAC   Post-op Pain Management:    Induction: Intravenous  Airway Management Planned: Nasal Cannula  Additional Equipment:   Intra-op Plan:   Post-operative Plan:   Informed Consent: I have reviewed the patients History and Physical, chart, labs and discussed the procedure including the risks, benefits and alternatives for the proposed anesthesia with the patient or authorized representative who has indicated his/her understanding and acceptance.     Plan Discussed with:   Anesthesia Plan Comments:         Anesthesia Quick Evaluation

## 2013-10-29 NOTE — Op Note (Signed)
Patient brought to the operating room and prepped and draped in the usual manner.  Lid speculum inserted in left eye.  Stab incision made at the twelve o'clock position.  Provisc instilled in the anterior chamber.   A 2.4 mm. Stab incision was made temporally.  An anterior capsulotomy was done with a bent 25 gauge needle.  The nucleus was hydrodissected.  The Phaco tip was inserted in the anterior chamber and the nucleus was emulsified.  CDE was unknown.  The cortical material was then removed with the I and A tip.  Posterior capsule was the polished.  The anterior chamber was deepened with Provisc.  A 22.0 Diopter Rayner 570C IOL was then inserted in the capsular bag.  Provisc was then removed with the I and A tip.  The wound was then hydrated.  Patient sent to the Recovery Room in good condition with follow up in my office.  Preoperative Diagnosis:  Nuclear Cataract OS Postoperative Diagnosis:  Same Procedure name: Kelman Phacoemulsification OS with IOL

## 2013-10-29 NOTE — Transfer of Care (Signed)
Immediate Anesthesia Transfer of Care Note  Patient: Gerald Robbins  Procedure(s) Performed: Procedure(s) (LRB): CATARACT EXTRACTION PHACO AND INTRAOCULAR LENS PLACEMENT LEFT EYE  (Left)  Patient Location: Shortstay  Anesthesia Type: MAC  Level of Consciousness: awake  Airway & Oxygen Therapy: Patient Spontanous Breathing   Post-op Assessment: Report given to PACU RN, Post -op Vital signs reviewed and stable and Patient moving all extremities  Post vital signs: Reviewed and stable  Complications: No apparent anesthesia complications

## 2013-10-29 NOTE — Discharge Instructions (Signed)
Gerald Robbins  10/29/2013           [facility] Instructions [address]- [place] [address]      1. Avoid closing eyes tightly. One often closes the eye tightly when laughing, talking, sneezing, coughing or if they feel irritated. At these times, you should be careful not to close your eyes tightly.  2. Instill eye drops as instructed. To instill drops in your eye, open it, look up and have someone gently pull the lower lid down and instill a couple of drops inside the lower lid.  3. Do not touch upper lid.  4. Take Advil or Tylenol for pain.  5. You may use either eye for near work, such as reading or sewing and you may watch television.  6. You may have your hair done at the beauty parlor at any time.  7. Wear dark glasses with or without your own glasses if you are in bright light.  8. Call our office at [phone] or [phone] if you have sharp pain in your eye or unusual symptoms.  9. Do not be concerned because vision in the operative eye is not good. It will not be good, no matter how successful the operation, until you get a special lens for it. Your old glasses will not be suited to the new eye that was operated on and you will not be ready for a new lens for about a month.  10. Follow up at the [facility] office.    I have received a copy of the above instructions and will follow them.     FOLLOW UP WITH DR. SHAPIRO TODAY BETWEEN 2-3 PM

## 2013-10-29 NOTE — H&P (Signed)
The patient was re examined and there is no change in the patients condition since the original H and P. 

## 2013-10-29 NOTE — Anesthesia Procedure Notes (Signed)
Procedure Name: MAC Date/Time: 10/29/2013 10:00 AM Performed by: Franco Nones Pre-anesthesia Checklist: Patient identified, Emergency Drugs available, Suction available, Timeout performed and Patient being monitored Patient Re-evaluated:Patient Re-evaluated prior to inductionOxygen Delivery Method: Nasal Cannula

## 2013-10-29 NOTE — Anesthesia Postprocedure Evaluation (Signed)
  Anesthesia Post-op Note  Patient: Gerald Robbins  Procedure(s) Performed: Procedure(s) (LRB): CATARACT EXTRACTION PHACO AND INTRAOCULAR LENS PLACEMENT LEFT EYE  (Left)  Patient Location:  Short Stay  Anesthesia Type: MAC  Level of Consciousness: awake  Airway and Oxygen Therapy: Patient Spontanous Breathing  Post-op Pain: none  Post-op Assessment: Post-op Vital signs reviewed, Patient's Cardiovascular Status Stable, Respiratory Function Stable, Patent Airway, No signs of Nausea or vomiting and Pain level controlled  Post-op Vital Signs: Reviewed and stable  Complications: No apparent anesthesia complications

## 2013-10-30 ENCOUNTER — Encounter (HOSPITAL_COMMUNITY): Payer: Self-pay | Admitting: Ophthalmology

## 2013-11-15 ENCOUNTER — Encounter (INDEPENDENT_AMBULATORY_CARE_PROVIDER_SITE_OTHER): Payer: Medicare Other | Admitting: Ophthalmology

## 2013-11-15 DIAGNOSIS — E1139 Type 2 diabetes mellitus with other diabetic ophthalmic complication: Secondary | ICD-10-CM

## 2013-11-15 DIAGNOSIS — H3530 Unspecified macular degeneration: Secondary | ICD-10-CM

## 2013-11-15 DIAGNOSIS — H43819 Vitreous degeneration, unspecified eye: Secondary | ICD-10-CM

## 2013-11-15 DIAGNOSIS — E1165 Type 2 diabetes mellitus with hyperglycemia: Secondary | ICD-10-CM

## 2013-11-15 DIAGNOSIS — H35039 Hypertensive retinopathy, unspecified eye: Secondary | ICD-10-CM

## 2013-11-15 DIAGNOSIS — H353290 Exudative age-related macular degeneration, unspecified eye, stage unspecified: Secondary | ICD-10-CM

## 2013-11-15 DIAGNOSIS — E11319 Type 2 diabetes mellitus with unspecified diabetic retinopathy without macular edema: Secondary | ICD-10-CM

## 2013-11-15 DIAGNOSIS — I10 Essential (primary) hypertension: Secondary | ICD-10-CM

## 2013-12-27 ENCOUNTER — Encounter (INDEPENDENT_AMBULATORY_CARE_PROVIDER_SITE_OTHER): Payer: Medicare Other | Admitting: Ophthalmology

## 2015-02-25 DIAGNOSIS — J449 Chronic obstructive pulmonary disease, unspecified: Secondary | ICD-10-CM | POA: Diagnosis not present

## 2015-02-25 DIAGNOSIS — I8291 Chronic embolism and thrombosis of unspecified vein: Secondary | ICD-10-CM | POA: Diagnosis not present

## 2015-03-28 DIAGNOSIS — J449 Chronic obstructive pulmonary disease, unspecified: Secondary | ICD-10-CM | POA: Diagnosis not present

## 2015-03-28 DIAGNOSIS — I8291 Chronic embolism and thrombosis of unspecified vein: Secondary | ICD-10-CM | POA: Diagnosis not present

## 2015-04-25 DIAGNOSIS — J449 Chronic obstructive pulmonary disease, unspecified: Secondary | ICD-10-CM | POA: Diagnosis not present

## 2015-04-25 DIAGNOSIS — I8291 Chronic embolism and thrombosis of unspecified vein: Secondary | ICD-10-CM | POA: Diagnosis not present

## 2015-05-07 DIAGNOSIS — Z0000 Encounter for general adult medical examination without abnormal findings: Secondary | ICD-10-CM | POA: Diagnosis not present

## 2015-05-07 DIAGNOSIS — J440 Chronic obstructive pulmonary disease with acute lower respiratory infection: Secondary | ICD-10-CM | POA: Diagnosis not present

## 2015-05-07 DIAGNOSIS — E119 Type 2 diabetes mellitus without complications: Secondary | ICD-10-CM | POA: Diagnosis not present

## 2015-05-26 DIAGNOSIS — I8291 Chronic embolism and thrombosis of unspecified vein: Secondary | ICD-10-CM | POA: Diagnosis not present

## 2015-05-26 DIAGNOSIS — J449 Chronic obstructive pulmonary disease, unspecified: Secondary | ICD-10-CM | POA: Diagnosis not present

## 2015-06-25 DIAGNOSIS — J449 Chronic obstructive pulmonary disease, unspecified: Secondary | ICD-10-CM | POA: Diagnosis not present

## 2015-06-25 DIAGNOSIS — I8291 Chronic embolism and thrombosis of unspecified vein: Secondary | ICD-10-CM | POA: Diagnosis not present

## 2015-07-26 DIAGNOSIS — J449 Chronic obstructive pulmonary disease, unspecified: Secondary | ICD-10-CM | POA: Diagnosis not present

## 2015-07-26 DIAGNOSIS — I8291 Chronic embolism and thrombosis of unspecified vein: Secondary | ICD-10-CM | POA: Diagnosis not present

## 2015-08-14 DIAGNOSIS — E119 Type 2 diabetes mellitus without complications: Secondary | ICD-10-CM | POA: Diagnosis not present

## 2015-08-14 DIAGNOSIS — K2960 Other gastritis without bleeding: Secondary | ICD-10-CM | POA: Diagnosis not present

## 2015-08-14 DIAGNOSIS — J440 Chronic obstructive pulmonary disease with acute lower respiratory infection: Secondary | ICD-10-CM | POA: Diagnosis not present

## 2015-08-25 DIAGNOSIS — I8291 Chronic embolism and thrombosis of unspecified vein: Secondary | ICD-10-CM | POA: Diagnosis not present

## 2015-08-25 DIAGNOSIS — J449 Chronic obstructive pulmonary disease, unspecified: Secondary | ICD-10-CM | POA: Diagnosis not present

## 2015-08-28 DIAGNOSIS — I252 Old myocardial infarction: Secondary | ICD-10-CM | POA: Diagnosis not present

## 2015-08-28 DIAGNOSIS — Z9889 Other specified postprocedural states: Secondary | ICD-10-CM | POA: Diagnosis not present

## 2015-08-28 DIAGNOSIS — K439 Ventral hernia without obstruction or gangrene: Secondary | ICD-10-CM | POA: Diagnosis not present

## 2015-08-28 DIAGNOSIS — R109 Unspecified abdominal pain: Secondary | ICD-10-CM | POA: Diagnosis not present

## 2015-08-28 DIAGNOSIS — I517 Cardiomegaly: Secondary | ICD-10-CM | POA: Diagnosis not present

## 2015-09-25 DIAGNOSIS — J449 Chronic obstructive pulmonary disease, unspecified: Secondary | ICD-10-CM | POA: Diagnosis not present

## 2015-09-25 DIAGNOSIS — I8291 Chronic embolism and thrombosis of unspecified vein: Secondary | ICD-10-CM | POA: Diagnosis not present

## 2015-10-26 DIAGNOSIS — J449 Chronic obstructive pulmonary disease, unspecified: Secondary | ICD-10-CM | POA: Diagnosis not present

## 2015-10-26 DIAGNOSIS — I8291 Chronic embolism and thrombosis of unspecified vein: Secondary | ICD-10-CM | POA: Diagnosis not present

## 2015-11-17 DIAGNOSIS — Z79899 Other long term (current) drug therapy: Secondary | ICD-10-CM | POA: Diagnosis not present

## 2015-11-17 DIAGNOSIS — Z0000 Encounter for general adult medical examination without abnormal findings: Secondary | ICD-10-CM | POA: Diagnosis not present

## 2015-11-17 DIAGNOSIS — I10 Essential (primary) hypertension: Secondary | ICD-10-CM | POA: Diagnosis not present

## 2015-11-17 DIAGNOSIS — M545 Low back pain: Secondary | ICD-10-CM | POA: Diagnosis not present

## 2015-11-17 DIAGNOSIS — K210 Gastro-esophageal reflux disease with esophagitis: Secondary | ICD-10-CM | POA: Diagnosis not present

## 2015-11-17 DIAGNOSIS — E1165 Type 2 diabetes mellitus with hyperglycemia: Secondary | ICD-10-CM | POA: Diagnosis not present

## 2015-11-17 DIAGNOSIS — J440 Chronic obstructive pulmonary disease with acute lower respiratory infection: Secondary | ICD-10-CM | POA: Diagnosis not present

## 2015-11-17 DIAGNOSIS — Z1389 Encounter for screening for other disorder: Secondary | ICD-10-CM | POA: Diagnosis not present

## 2015-11-25 DIAGNOSIS — J449 Chronic obstructive pulmonary disease, unspecified: Secondary | ICD-10-CM | POA: Diagnosis not present

## 2015-11-25 DIAGNOSIS — I8291 Chronic embolism and thrombosis of unspecified vein: Secondary | ICD-10-CM | POA: Diagnosis not present

## 2015-12-12 DIAGNOSIS — Z86718 Personal history of other venous thrombosis and embolism: Secondary | ICD-10-CM | POA: Diagnosis not present

## 2015-12-12 DIAGNOSIS — J449 Chronic obstructive pulmonary disease, unspecified: Secondary | ICD-10-CM | POA: Diagnosis not present

## 2015-12-12 DIAGNOSIS — A419 Sepsis, unspecified organism: Secondary | ICD-10-CM | POA: Diagnosis not present

## 2015-12-12 DIAGNOSIS — S31101A Unspecified open wound of abdominal wall, left upper quadrant without penetration into peritoneal cavity, initial encounter: Secondary | ICD-10-CM | POA: Diagnosis not present

## 2015-12-12 DIAGNOSIS — R404 Transient alteration of awareness: Secondary | ICD-10-CM | POA: Diagnosis not present

## 2015-12-12 DIAGNOSIS — R531 Weakness: Secondary | ICD-10-CM | POA: Diagnosis not present

## 2015-12-12 DIAGNOSIS — S31103A Unspecified open wound of abdominal wall, right lower quadrant without penetration into peritoneal cavity, initial encounter: Secondary | ICD-10-CM | POA: Diagnosis not present

## 2015-12-12 DIAGNOSIS — N390 Urinary tract infection, site not specified: Secondary | ICD-10-CM | POA: Diagnosis not present

## 2015-12-12 DIAGNOSIS — J181 Lobar pneumonia, unspecified organism: Secondary | ICD-10-CM | POA: Diagnosis not present

## 2015-12-12 DIAGNOSIS — D539 Nutritional anemia, unspecified: Secondary | ICD-10-CM | POA: Diagnosis not present

## 2015-12-12 DIAGNOSIS — J9601 Acute respiratory failure with hypoxia: Secondary | ICD-10-CM | POA: Diagnosis not present

## 2015-12-12 DIAGNOSIS — Z86711 Personal history of pulmonary embolism: Secondary | ICD-10-CM | POA: Diagnosis not present

## 2015-12-12 DIAGNOSIS — Z79899 Other long term (current) drug therapy: Secondary | ICD-10-CM | POA: Diagnosis not present

## 2015-12-12 DIAGNOSIS — D649 Anemia, unspecified: Secondary | ICD-10-CM | POA: Diagnosis not present

## 2015-12-12 DIAGNOSIS — N189 Chronic kidney disease, unspecified: Secondary | ICD-10-CM | POA: Diagnosis not present

## 2015-12-13 DIAGNOSIS — N3000 Acute cystitis without hematuria: Secondary | ICD-10-CM | POA: Diagnosis not present

## 2015-12-13 DIAGNOSIS — R451 Restlessness and agitation: Secondary | ICD-10-CM | POA: Diagnosis not present

## 2015-12-13 DIAGNOSIS — I959 Hypotension, unspecified: Secondary | ICD-10-CM | POA: Diagnosis not present

## 2015-12-13 DIAGNOSIS — N400 Enlarged prostate without lower urinary tract symptoms: Secondary | ICD-10-CM | POA: Diagnosis not present

## 2015-12-13 DIAGNOSIS — I454 Nonspecific intraventricular block: Secondary | ICD-10-CM | POA: Diagnosis not present

## 2015-12-13 DIAGNOSIS — R0902 Hypoxemia: Secondary | ICD-10-CM | POA: Diagnosis not present

## 2015-12-13 DIAGNOSIS — Z79890 Hormone replacement therapy (postmenopausal): Secondary | ICD-10-CM | POA: Diagnosis not present

## 2015-12-13 DIAGNOSIS — G8929 Other chronic pain: Secondary | ICD-10-CM | POA: Diagnosis not present

## 2015-12-13 DIAGNOSIS — I517 Cardiomegaly: Secondary | ICD-10-CM | POA: Diagnosis not present

## 2015-12-13 DIAGNOSIS — N179 Acute kidney failure, unspecified: Secondary | ICD-10-CM | POA: Diagnosis not present

## 2015-12-13 DIAGNOSIS — I252 Old myocardial infarction: Secondary | ICD-10-CM | POA: Diagnosis not present

## 2015-12-13 DIAGNOSIS — E874 Mixed disorder of acid-base balance: Secondary | ICD-10-CM | POA: Diagnosis not present

## 2015-12-13 DIAGNOSIS — N136 Pyonephrosis: Secondary | ICD-10-CM | POA: Diagnosis not present

## 2015-12-13 DIAGNOSIS — R9431 Abnormal electrocardiogram [ECG] [EKG]: Secondary | ICD-10-CM | POA: Diagnosis not present

## 2015-12-13 DIAGNOSIS — K469 Unspecified abdominal hernia without obstruction or gangrene: Secondary | ICD-10-CM | POA: Diagnosis not present

## 2015-12-13 DIAGNOSIS — J441 Chronic obstructive pulmonary disease with (acute) exacerbation: Secondary | ICD-10-CM | POA: Diagnosis not present

## 2015-12-13 DIAGNOSIS — I440 Atrioventricular block, first degree: Secondary | ICD-10-CM | POA: Diagnosis not present

## 2015-12-13 DIAGNOSIS — E872 Acidosis: Secondary | ICD-10-CM | POA: Diagnosis not present

## 2015-12-13 DIAGNOSIS — A419 Sepsis, unspecified organism: Secondary | ICD-10-CM | POA: Diagnosis not present

## 2015-12-13 DIAGNOSIS — Z4802 Encounter for removal of sutures: Secondary | ICD-10-CM | POA: Diagnosis not present

## 2015-12-13 DIAGNOSIS — N132 Hydronephrosis with renal and ureteral calculous obstruction: Secondary | ICD-10-CM | POA: Diagnosis not present

## 2015-12-13 DIAGNOSIS — N3090 Cystitis, unspecified without hematuria: Secondary | ICD-10-CM | POA: Diagnosis not present

## 2015-12-13 DIAGNOSIS — J9621 Acute and chronic respiratory failure with hypoxia: Secondary | ICD-10-CM | POA: Diagnosis not present

## 2015-12-13 DIAGNOSIS — M25529 Pain in unspecified elbow: Secondary | ICD-10-CM | POA: Diagnosis not present

## 2015-12-13 DIAGNOSIS — N183 Chronic kidney disease, stage 3 (moderate): Secondary | ICD-10-CM | POA: Diagnosis not present

## 2015-12-13 DIAGNOSIS — R140 Abdominal distension (gaseous): Secondary | ICD-10-CM | POA: Diagnosis not present

## 2015-12-13 DIAGNOSIS — J9811 Atelectasis: Secondary | ICD-10-CM | POA: Diagnosis not present

## 2015-12-13 DIAGNOSIS — M795 Residual foreign body in soft tissue: Secondary | ICD-10-CM | POA: Diagnosis not present

## 2015-12-13 DIAGNOSIS — Z9981 Dependence on supplemental oxygen: Secondary | ICD-10-CM | POA: Diagnosis not present

## 2015-12-13 DIAGNOSIS — I493 Ventricular premature depolarization: Secondary | ICD-10-CM | POA: Diagnosis not present

## 2015-12-13 DIAGNOSIS — R410 Disorientation, unspecified: Secondary | ICD-10-CM | POA: Diagnosis not present

## 2015-12-13 DIAGNOSIS — I445 Left posterior fascicular block: Secondary | ICD-10-CM | POA: Diagnosis not present

## 2015-12-13 DIAGNOSIS — I5033 Acute on chronic diastolic (congestive) heart failure: Secondary | ICD-10-CM | POA: Diagnosis not present

## 2015-12-13 DIAGNOSIS — J9601 Acute respiratory failure with hypoxia: Secondary | ICD-10-CM | POA: Diagnosis not present

## 2015-12-13 DIAGNOSIS — I2510 Atherosclerotic heart disease of native coronary artery without angina pectoris: Secondary | ICD-10-CM | POA: Diagnosis not present

## 2015-12-13 DIAGNOSIS — Z515 Encounter for palliative care: Secondary | ICD-10-CM | POA: Diagnosis not present

## 2015-12-13 DIAGNOSIS — K5900 Constipation, unspecified: Secondary | ICD-10-CM | POA: Diagnosis not present

## 2015-12-13 DIAGNOSIS — I480 Paroxysmal atrial fibrillation: Secondary | ICD-10-CM | POA: Diagnosis not present

## 2015-12-13 DIAGNOSIS — M25522 Pain in left elbow: Secondary | ICD-10-CM | POA: Diagnosis not present

## 2015-12-13 DIAGNOSIS — R739 Hyperglycemia, unspecified: Secondary | ICD-10-CM | POA: Diagnosis not present

## 2015-12-13 DIAGNOSIS — E876 Hypokalemia: Secondary | ICD-10-CM | POA: Diagnosis not present

## 2015-12-13 DIAGNOSIS — E1165 Type 2 diabetes mellitus with hyperglycemia: Secondary | ICD-10-CM | POA: Diagnosis not present

## 2015-12-13 DIAGNOSIS — Z66 Do not resuscitate: Secondary | ICD-10-CM | POA: Diagnosis not present

## 2015-12-13 DIAGNOSIS — J9602 Acute respiratory failure with hypercapnia: Secondary | ICD-10-CM | POA: Diagnosis not present

## 2015-12-13 DIAGNOSIS — I4581 Long QT syndrome: Secondary | ICD-10-CM | POA: Diagnosis not present

## 2015-12-13 DIAGNOSIS — I2699 Other pulmonary embolism without acute cor pulmonale: Secondary | ICD-10-CM | POA: Diagnosis not present

## 2015-12-13 DIAGNOSIS — D72829 Elevated white blood cell count, unspecified: Secondary | ICD-10-CM | POA: Diagnosis not present

## 2015-12-13 DIAGNOSIS — R918 Other nonspecific abnormal finding of lung field: Secondary | ICD-10-CM | POA: Diagnosis not present

## 2015-12-13 DIAGNOSIS — G4700 Insomnia, unspecified: Secondary | ICD-10-CM | POA: Diagnosis not present

## 2015-12-17 DIAGNOSIS — I440 Atrioventricular block, first degree: Secondary | ICD-10-CM | POA: Diagnosis not present

## 2015-12-17 DIAGNOSIS — I493 Ventricular premature depolarization: Secondary | ICD-10-CM | POA: Diagnosis not present

## 2016-01-22 DEATH — deceased
